# Patient Record
Sex: Male | Born: 1980 | Race: Black or African American | Hispanic: No | Marital: Single | Smoking: Current every day smoker
Health system: Southern US, Community
[De-identification: ages and names within clinical notes are randomized; demographics above are authoritative.]

---

## 2004-07-27 ENCOUNTER — Emergency Department: Payer: Self-pay | Admitting: Emergency Medicine

## 2007-02-11 ENCOUNTER — Emergency Department: Payer: Self-pay | Admitting: Emergency Medicine

## 2008-10-15 ENCOUNTER — Emergency Department: Payer: Self-pay | Admitting: Emergency Medicine

## 2008-10-17 ENCOUNTER — Emergency Department: Payer: Self-pay | Admitting: Emergency Medicine

## 2008-12-10 ENCOUNTER — Emergency Department: Payer: Self-pay | Admitting: Emergency Medicine

## 2009-01-20 IMAGING — CR DG ABDOMEN 3V
1 series · 5 of 5 positions shown · non-contrast
Comparison: none

REASON FOR EXAM: abdominal pain
COMMENTS:

[Series 1: view not recorded · 0.17mm/px · 5 of 5 slices shown]
[im 1/5]
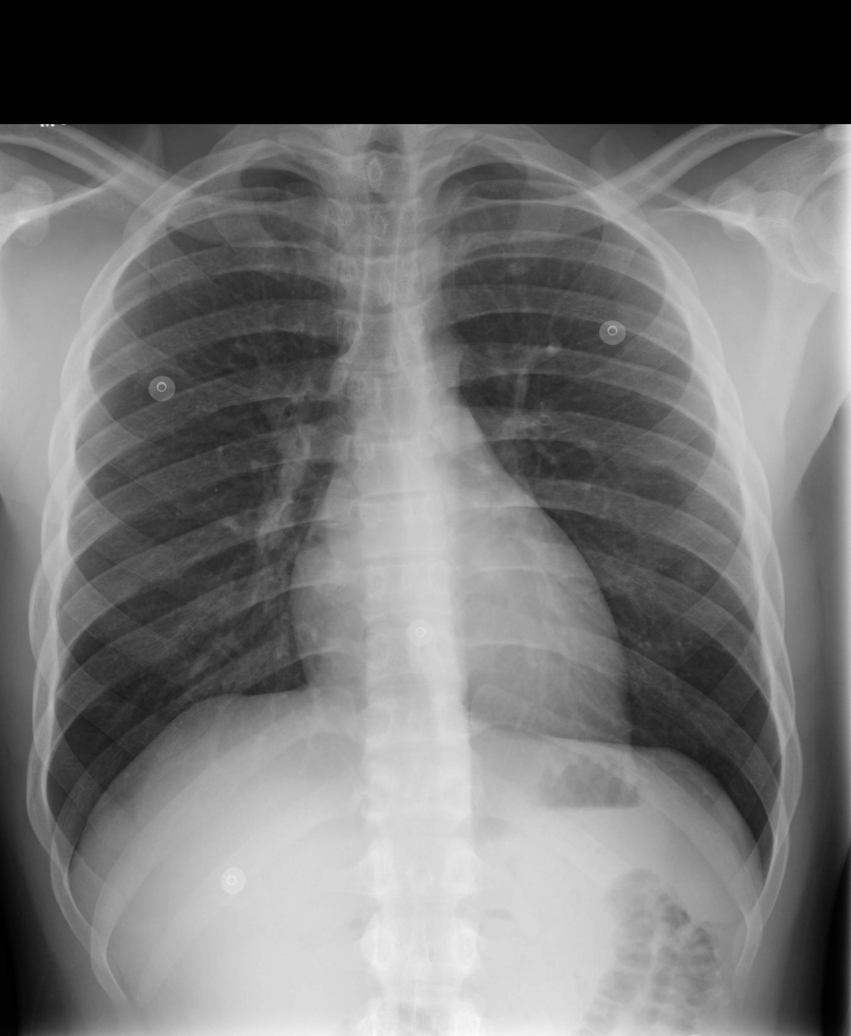
[im 2/5]
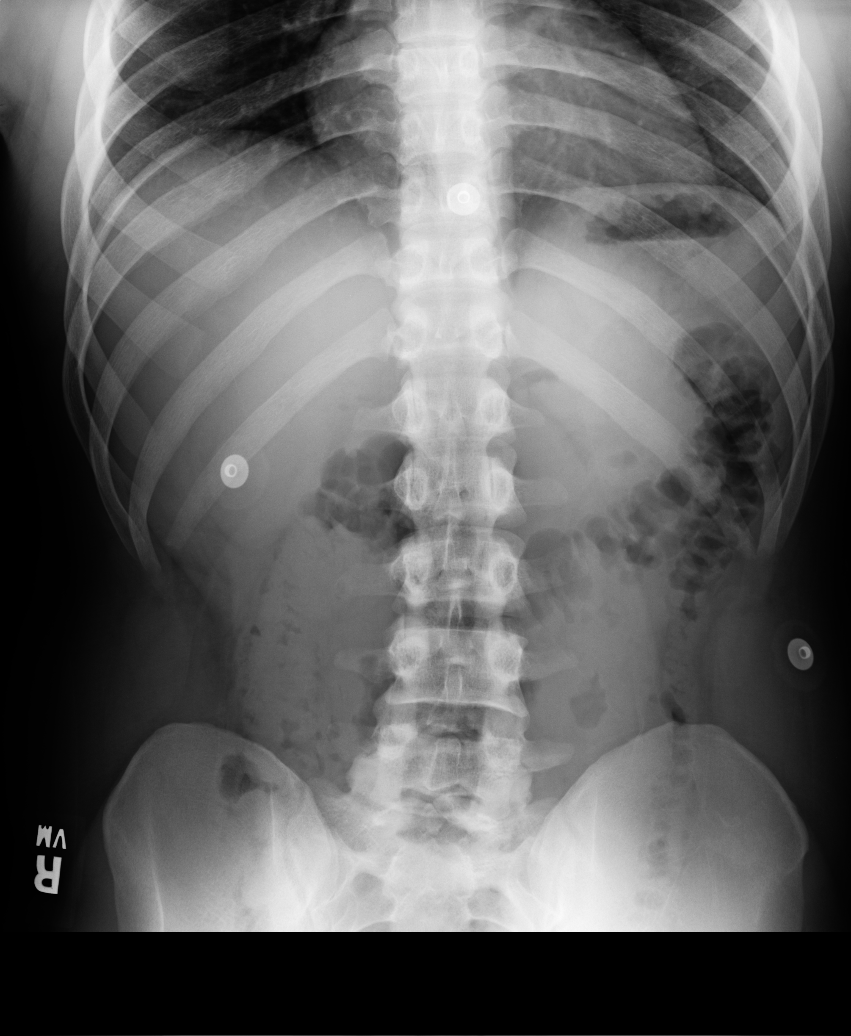
[im 3/5]
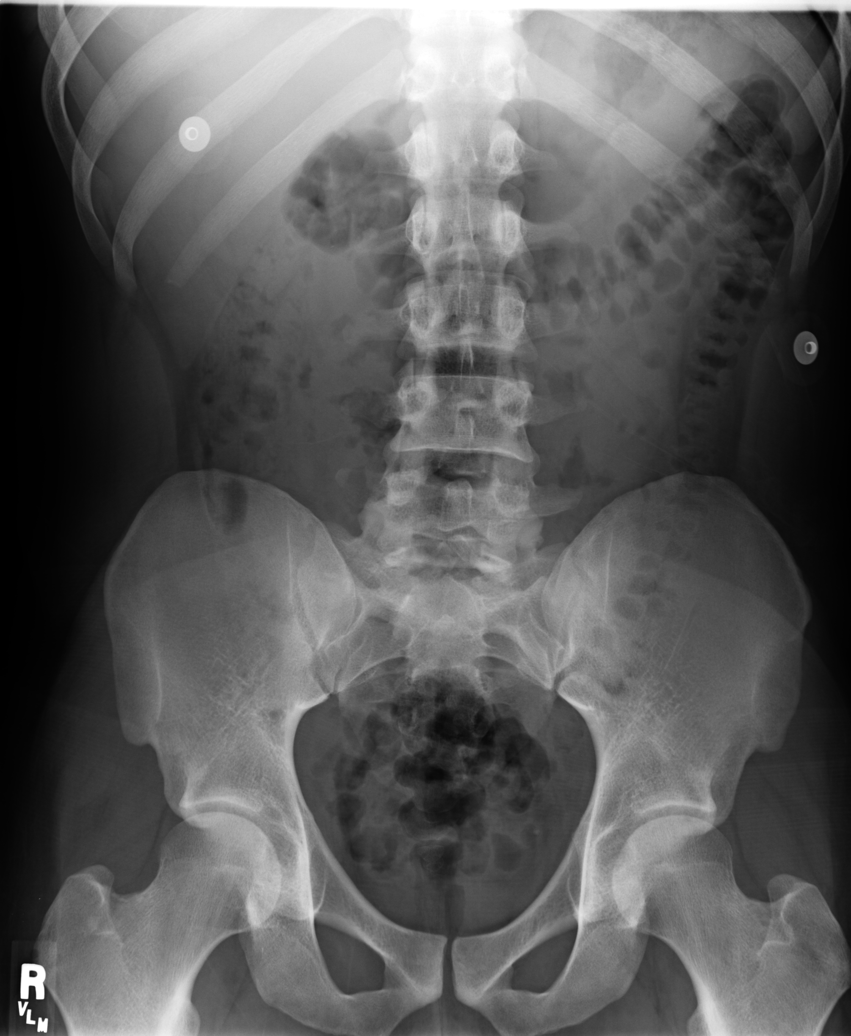
[im 4/5]
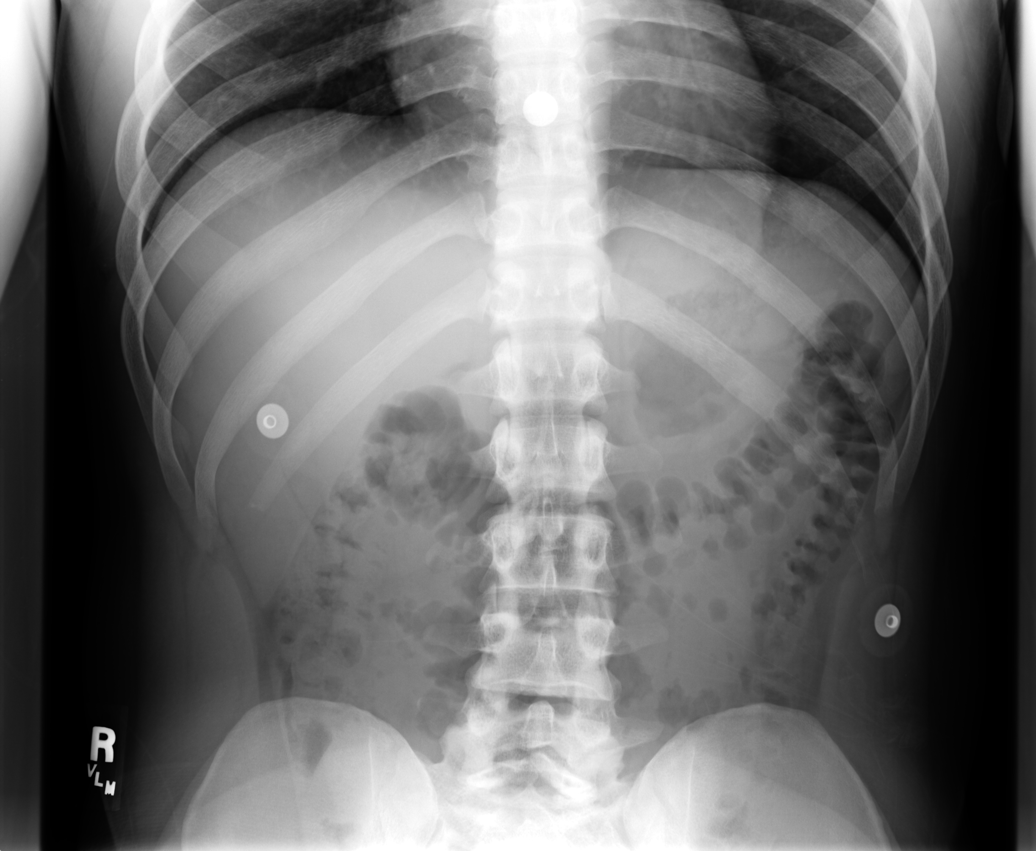
[im 5/5]
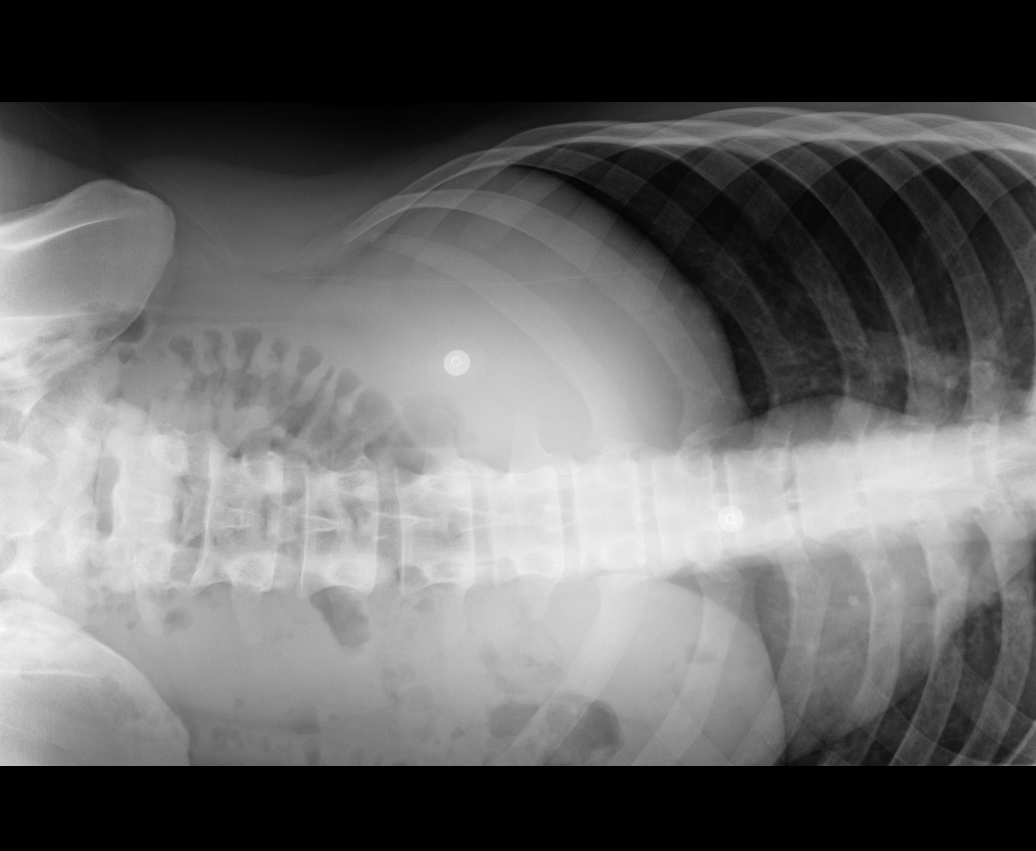

[5 of 5 positions shown; findings below may reference images not displayed]

PROCEDURE:     DXR - DXR ABDOMEN 3-WAY (INCL PA CXR)  - February 11, 2007  [DATE]

RESULT:     Single frontal view of the chest demonstrates no evidence of
focal infiltrates, effusions, or edema.

Air is seen within nondilated loops of large and small bowel.  A moderate
amount of stool is appreciated within the colon. The visualized bony
skeleton demonstrates no evidence of fracture or dislocation.
IMPRESSION: Nonobstructive bowel gas pattern.

Chest radiograph without evidence of acute cardiopulmonary disease.

## 2009-02-02 ENCOUNTER — Emergency Department: Payer: Self-pay | Admitting: Unknown Physician Specialty

## 2009-02-04 ENCOUNTER — Emergency Department: Payer: Self-pay | Admitting: Emergency Medicine

## 2010-08-17 ENCOUNTER — Emergency Department: Payer: Self-pay | Admitting: Emergency Medicine

## 2010-08-29 ENCOUNTER — Emergency Department: Payer: Self-pay | Admitting: Emergency Medicine

## 2012-02-06 ENCOUNTER — Emergency Department: Payer: Self-pay | Admitting: Emergency Medicine

## 2012-09-30 ENCOUNTER — Emergency Department: Payer: Self-pay | Admitting: Emergency Medicine

## 2013-03-19 ENCOUNTER — Ambulatory Visit: Payer: Self-pay | Admitting: General Practice

## 2013-03-19 LAB — CBC
HCT: 41.4 % (ref 40.0–52.0)
MCV: 86 fL (ref 80–100)
RDW: 14.9 % — ABNORMAL HIGH (ref 11.5–14.5)

## 2013-03-19 LAB — BASIC METABOLIC PANEL
Anion Gap: 4 — ABNORMAL LOW (ref 7–16)
Calcium, Total: 9.2 mg/dL (ref 8.5–10.1)
Chloride: 106 mmol/L (ref 98–107)
Co2: 28 mmol/L (ref 21–32)
Creatinine: 1.02 mg/dL (ref 0.60–1.30)
Potassium: 3.7 mmol/L (ref 3.5–5.1)
Sodium: 138 mmol/L (ref 136–145)

## 2014-12-13 NOTE — Op Note (Signed)
PATIENT NAME:  Dennis Burgess, Dennis L MR#:  [id number] DATE OF BIRTH:  30-May-1981  DATE OF PROCEDURE:  03/19/2013  PREOPERATIVE DIAGNOSIS: Laceration to the first webspace of the left hand.   POSTOPERATIVE DIAGNOSIS: Laceration to the first webspace of the left hand.  PROCEDURE PERFORMED: Irrigation, debridement and repair of laceration to the first webspace of the left hand.   SURGEON: Illene LabradorJames P. Angie FavaHooten Jr., MD  ANESTHESIA: General.   ESTIMATED BLOOD LOSS: None.   FLUIDS REPLACED: 500 mL of crystalloid.   TOURNIQUET TIME: Not used.   DRAINS: None.   INDICATIONS FOR SURGERY: The patient is a 34 year old right-hand dominant male who sustained a laceration to the first webspace of the left hand while at work. The Emergency Room physician had difficulty with hemostasis and asked for consultation and assistance. After discussion of the risks and benefits of surgical intervention, the patient expressed understanding of the risks and benefits and agreed with plans for surgical intervention.   PROCEDURE IN DETAIL: The patient was brought into the operating room and, after adequate general anesthesia was achieved, a tourniquet was placed on the patient's upper left arm. The patient's left hand was cleaned and prepped with Betadine. Direct pressure had been applied to the laceration site during the period of time in the operating room. A "timeout" was performed as per usual protocol. The laceration measured 3 cm in its entirety. The laceration site was carefully retracted open. No gross bleeding was encountered at the site. There appeared to be good capillary refill. The laceration did extend through muscle tissue, although no direct bleeding from the muscle tissue was noted. The wound was irrigated with copious amounts of normal saline with antibiotic solution. Again, there was no evidence of pulsatile bleeding or significant bleeding from the wound itself. The skin edges were reapproximated with  interrupted sutures of #4-0 nylon. A sterile dressing was applied, followed by application of a bulky hand dressing. The patient tolerated the procedure well. He was transported to the recovery room in stable condition.   ____________________________ Illene LabradorJames P. Angie FavaHooten Jr., MD jph:OSi D: 03/20/2013 01:32:23 ET T: 03/20/2013 05:45:45 ET JOB#: [id number]  cc: Illene LabradorJames P. Angie FavaHooten Jr., MD, <Dictator> Illene LabradorJAMES P Angie FavaHOOTEN JR MD ELECTRONICALLY SIGNED 03/23/2013 18:06

## 2015-02-15 ENCOUNTER — Encounter: Payer: Self-pay | Admitting: Emergency Medicine

## 2015-02-15 ENCOUNTER — Emergency Department
Admission: EM | Admit: 2015-02-15 | Discharge: 2015-02-15 | Disposition: A | Discharge status: Home / Self Care | Payer: Managed Care, Other (non HMO) | Attending: Emergency Medicine | Admitting: Emergency Medicine

## 2015-02-15 DIAGNOSIS — Z720 Tobacco use: Secondary | ICD-10-CM | POA: Diagnosis not present

## 2015-02-15 DIAGNOSIS — L02215 Cutaneous abscess of perineum: Secondary | ICD-10-CM | POA: Diagnosis present

## 2015-02-15 MED ORDER — SULFAMETHOXAZOLE-TRIMETHOPRIM 800-160 MG PO TABS: 1.0000 | ORAL_TABLET | Freq: Two times a day (BID) | ORAL | Status: DC

## 2015-02-15 MED ORDER — LIDOCAINE HCL (PF) 1 % IJ SOLN
2.0000 mL | Freq: Once | INTRAMUSCULAR | Status: AC
  Administered 2015-02-15: 2 mL

## 2015-02-15 MED ORDER — LIDOCAINE HCL (PF) 1 % IJ SOLN
INTRAMUSCULAR | Status: AC
  Administered 2015-02-15: 2 mL
  Filled 2015-02-15: qty 10

## 2015-02-15 MED ORDER — HYDROCODONE-ACETAMINOPHEN 5-325 MG PO TABS
1.0000 | ORAL_TABLET | Freq: Four times a day (QID) | ORAL | Status: DC | PRN
Start: 2015-02-15 — End: 2018-06-20

## 2015-02-15 NOTE — Discharge Instructions (Signed)

## 2015-02-15 NOTE — ED Notes (Signed)
Patient states that he has an abscess to hit buttocks times one week.

## 2015-02-15 NOTE — ED Provider Notes (Signed)
CSN: [id number]     Arrival date & time 02/15/15  2127 History   First MD Initiated Contact with Patient 02/15/15 2159     Chief Complaint  Patient presents with  . Abscess     (Consider location/radiation/quality/duration/timing/severity/associated sxs/prior Treatment) HPI  34 year old male presents to the emergency department for evaluation of perineal abscess. Patient has had abscess formation for 7 days. He believes the small abscess began after he was shaving. Patient denies any fevers warmth redness. Pain is 7 out of 10 with walking only. He describes the pain as pressure. He has no pain with rest. He has a history of a similar abscess in the same region years ago. No fevers numbness tingling or weakness in the lower extremities.   History reviewed. No pertinent past medical history. History reviewed. No pertinent past surgical history. History reviewed. No pertinent family history. History  Substance Use Topics  . Smoking status: Current Every Day Smoker -- 0.50 packs/day for 1 years    Types: Cigarettes  . Smokeless tobacco: Not on file  . Alcohol Use: Yes     Comment: occasionaly    Review of Systems  Constitutional: Negative.  Negative for fever, chills, activity change and appetite change.  HENT: Negative for congestion, ear pain, mouth sores, rhinorrhea, sinus pressure, sore throat and trouble swallowing.   Eyes: Negative for photophobia, pain and discharge.  Respiratory: Negative for cough, chest tightness and shortness of breath.   Cardiovascular: Negative for chest pain and leg swelling.  Gastrointestinal: Negative for nausea, vomiting, abdominal pain, diarrhea and abdominal distention.  Genitourinary: Negative for dysuria and difficulty urinating.  Musculoskeletal: Negative for back pain, arthralgias and gait problem.  Skin: Positive for wound. Negative for color change.  Neurological: Negative for dizziness and headaches.  Hematological: Negative for adenopathy.   Psychiatric/Behavioral: Negative for behavioral problems and agitation.      Allergies  Review of patient's allergies indicates not on file.  Home Medications   Prior to Admission medications   Medication Sig Start Date End Date Taking? Authorizing Provider  HYDROcodone-acetaminophen (NORCO) 5-325 MG per tablet Take 1 tablet by mouth every 6 (six) hours as needed for moderate pain. 02/15/15   Evon Slack, PA-C  sulfamethoxazole-trimethoprim (BACTRIM DS,SEPTRA DS) 800-160 MG per tablet Take 1 tablet by mouth 2 (two) times daily. 02/15/15   Evon Slack, PA-C   BP 128/85 mmHg  Pulse 83  Temp(Src) 98.2 F (36.8 C) (Oral)  Resp 18  Ht  (1.676 m)  Wt 189 lb (85.73 kg)  BMI 30.52 kg/m2  SpO2 98% Physical Exam  Constitutional: He is oriented to person, place, and time. He appears well-developed and well-nourished.  HENT:  Head: Normocephalic and atraumatic.  Eyes: Conjunctivae and EOM are normal. Pupils are equal, round, and reactive to light.  Neck: Normal range of motion. Neck supple.  Cardiovascular: Normal rate, regular rhythm, normal heart sounds and intact distal pulses.   Pulmonary/Chest: Effort normal and breath sounds normal. No respiratory distress. He has no wheezes. He has no rales. He exhibits no tenderness.  Abdominal: Soft. Bowel sounds are normal. He exhibits no distension. There is no tenderness.  Genitourinary: Rectum normal and penis normal. No penile tenderness.  No perirectal abscess, external hemorrhoid or tenderness.  Musculoskeletal: Normal range of motion. He exhibits no edema or tenderness.  Neurological: He is alert and oriented to person, place, and time.  Skin: Skin is warm and dry.  Between the base of the scrotum and the  rectum there is a small 2 x 2 centimeter firm fluctuant abscess. There is mild erythema surrounding the abscess. Fluctuant area is minimally tender. No necrotic tissue present throughout perineal region.  Psychiatric: He has  a normal mood and affect. His behavior is normal. Judgment and thought content normal.    ED Course  Procedures (including critical care time) INCISION AND DRAINAGE Performed by: Patience Musca Consent: Verbal consent obtained. Risks and benefits: risks, benefits and alternatives were discussed Type: abscess  Body area: Perineal abscess small 2 x 2 centimeters  Anesthesia: local infiltration  Incision was made with a scalpel.  Local anesthetic: lidocaine 1 % without epinephrine  Anesthetic total: 2 ml  Complexity: complex Blunt dissection to break up loculations  Drainage: purulent  Drainage amount: 3 cc purulent drainage  Packing material: No packing. Too small  Patient tolerance: Patient tolerated the procedure well with no immediate complications.   Labs Review Labs Reviewed - No data to display  Imaging Review No results found.   EKG Interpretation None      MDM   Final diagnoses:  Perineal abscess, superficial    34 year old male with small superficial perineal abscess. Patient received nearly 100% relief after incision and drainage. Patient will be started on Bactrim. He will take Norco as needed for pain. Follow-up in 2-3 days for recheck. No signs of forniers gangrene.    Evon Slack, PA-C 02/15/15 2231  Darien Ramus, MD 02/16/15 [phone]

## 2015-03-16 ENCOUNTER — Emergency Department
Admission: EM | Admit: 2015-03-16 | Discharge: 2015-03-16 | Disposition: A | Discharge status: Home / Self Care | Payer: Managed Care, Other (non HMO) | Attending: Emergency Medicine | Admitting: Emergency Medicine

## 2015-03-16 ENCOUNTER — Encounter: Payer: Self-pay | Admitting: Emergency Medicine

## 2015-03-16 DIAGNOSIS — K0381 Cracked tooth: Secondary | ICD-10-CM | POA: Diagnosis not present

## 2015-03-16 DIAGNOSIS — K088 Other specified disorders of teeth and supporting structures: Secondary | ICD-10-CM | POA: Insufficient documentation

## 2015-03-16 DIAGNOSIS — K0889 Other specified disorders of teeth and supporting structures: Secondary | ICD-10-CM

## 2015-03-16 DIAGNOSIS — Z792 Long term (current) use of antibiotics: Secondary | ICD-10-CM | POA: Diagnosis not present

## 2015-03-16 DIAGNOSIS — Z720 Tobacco use: Secondary | ICD-10-CM | POA: Insufficient documentation

## 2015-03-16 MED ORDER — IBUPROFEN 800 MG PO TABS: 800.0000 mg | ORAL_TABLET | Freq: Three times a day (TID) | ORAL | Status: DC | PRN

## 2015-03-16 MED ORDER — OXYCODONE-ACETAMINOPHEN 5-325 MG PO TABS
1.0000 | ORAL_TABLET | Freq: Once | ORAL | Status: AC
  Administered 2015-03-16: 1 via ORAL
  Filled 2015-03-16: qty 1

## 2015-03-16 MED ORDER — AMOXICILLIN 500 MG PO CAPS: 500.0000 mg | ORAL_CAPSULE | Freq: Three times a day (TID) | ORAL | Status: DC

## 2015-03-16 MED ORDER — TRAMADOL HCL 50 MG PO TABS: 50.0000 mg | ORAL_TABLET | Freq: Four times a day (QID) | ORAL | Status: DC | PRN

## 2015-03-16 MED ORDER — IBUPROFEN 800 MG PO TABS
800.0000 mg | ORAL_TABLET | Freq: Once | ORAL | Status: AC
  Administered 2015-03-16: 800 mg via ORAL
  Filled 2015-03-16: qty 1

## 2015-03-16 NOTE — ED Provider Notes (Signed)
[facility] Emergency Department Provider Note  ____________________________________________  Time seen: Approximately 7:50 PM  I have reviewed the triage vital signs and the nursing notes.   HISTORY  Chief Complaint Dental Pain    HPI Dennis Burgess is a 34 y.o. male patient complained of dental pain for 1 week. Patient state he's had a broken tooth for over 3 months and the pain is getting increasingly worse. Patient state is unable to chew on that side secondary to complain of pain. Patient rated his pain as a 10 over 10. Patient state pain is is not relieved with over-the-counter medications. Patient denies any fever or chills associated with this complaint.   History reviewed. No pertinent past medical history.  There are no active problems to display for this patient.   History reviewed. No pertinent past surgical history.  Current Outpatient Rx  Name  Route  Sig  Dispense  Refill  . amoxicillin (AMOXIL) 500 MG capsule   Oral   Take 1 capsule (500 mg total) by mouth 3 (three) times daily.   30 capsule   0   . HYDROcodone-acetaminophen (NORCO) 5-325 MG per tablet   Oral   Take 1 tablet by mouth every 6 (six) hours as needed for moderate pain.   15 tablet   0   . ibuprofen (ADVIL,MOTRIN) 800 MG tablet   Oral   Take 1 tablet (800 mg total) by mouth every 8 (eight) hours as needed for moderate pain.   15 tablet   0   . sulfamethoxazole-trimethoprim (BACTRIM DS,SEPTRA DS) 800-160 MG per tablet   Oral   Take 1 tablet by mouth 2 (two) times daily.   14 tablet   0   . traMADol (ULTRAM) 50 MG tablet   Oral   Take 1 tablet (50 mg total) by mouth every 6 (six) hours as needed for moderate pain.   12 tablet   0     Allergies Review of patient's allergies indicates no known allergies.  History reviewed. No pertinent family history.  Social History History  Substance Use Topics  . Smoking status: Current Every Day Smoker -- 0.50  packs/day for 1 years    Types: Cigarettes  . Smokeless tobacco: Not on file  . Alcohol Use: Yes     Comment: occasionaly    Review of Systems Constitutional: No fever/chills Eyes: No visual changes. ENT: No sore throat. Until pain Cardiovascular: Denies chest pain. Respiratory: Denies shortness of breath. Gastrointestinal: No abdominal pain.  No nausea, no vomiting.  No diarrhea.  No constipation. Genitourinary: Negative for dysuria. Musculoskeletal: Negative for back pain. Skin: Negative for rash. Neurological: Negative for headaches, focal weakness or numbness. 10-point ROS otherwise negative.  ____________________________________________   PHYSICAL EXAM:  VITAL SIGNS: ED Triage Vitals  Enc Vitals Group     BP 03/16/15 1851 131/62 mmHg     Pulse Rate 03/16/15 1851 91     Resp 03/16/15 1851 18     Temp 03/16/15 1851 98 F (36.7 C)     Temp Source 03/16/15 1851 Oral     SpO2 03/16/15 1851 97 %     Weight 03/16/15 1851 180 lb (81.647 kg)     Height 03/16/15 1851  (1.676 m)     Head Cir --      Peak Flow --      Pain Score 03/16/15 1853 10     Pain Loc --      Pain Edu? --  Excl. in GC? --     Constitutional: Alert and oriented. Well appearing and in no acute distress. Eyes: Conjunctivae are normal. PERRL. EOMI. Head: Atraumatic. Nose: No congestion/rhinnorhea. Mouth/Throat: Mucous membranes are moist.  Oropharynx non-erythematous. Fractured tooth #29. Gingiva is not erythematous or edematous. Neck: No stridor.  No cervical spine tenderness to palpation. Hematological/Lymphatic/Immunilogical: No cervical lymphadenopathy. Cardiovascular: Normal rate, regular rhythm. Grossly normal heart sounds.  Good peripheral circulation. Respiratory: Normal respiratory effort.  No retractions. Lungs CTAB. Gastrointestinal: Soft and nontender. No distention. No abdominal bruits. No CVA tenderness. Musculoskeletal: No lower extremity tenderness nor edema.  No joint  effusions. Neurologic:  Normal speech and language. No gross focal neurologic deficits are appreciated. No gait instability. Skin:  Skin is warm, dry and intact. No rash noted. Psychiatric: Mood and affect are normal. Speech and behavior are normal.  ____________________________________________   LABS (all labs ordered are listed, but only abnormal results are displayed)  Labs Reviewed - No data to display ____________________________________________  EKG   ____________________________________________  RADIOLOGY   ____________________________________________   PROCEDURES  Procedure(s) performed: None  Critical Care performed: No  ____________________________________________   INITIAL IMPRESSION / ASSESSMENT AND PLAN / ED COURSE  Pertinent labs & imaging results that were available during my care of the patient were reviewed by me and considered in my medical decision making (see chart for details).  Dental pain secondary to fractured tooth. Patient advised follow-up for shortness of dental clinic provided on his discharge. Patient given 3 days supply of tramadol and finally supply of ibuprofen. Patient is also prescribed amoxicillin. Patient advised return to ER if his condition worsens before seeing the dentist.  OPTIONS FOR DENTAL FOLLOW UP CARE  [place] [facility] - Local Safety Net Dental Clinics [email or web address]   [facility] [phone])  Sharl Ma [phone])  [place] [phone])  [facility] [phone])  [facility] [phone]) This clinic caters to the indigent population and is on a lottery system. Location: [employer] of Dentistry, [employer], [address], [place] Clinic Hours: Wednesdays from 6pm - 9pm, patients seen by a lottery system. For dates, call or go to  [email or web address] Services: Cleanings, fillings and simple extractions. Payment Options: DENTAL WORK IS FREE OF CHARGE. Bring proof of income or support. Best way to get seen: Arrive at 5:15 pm - this is a lottery, NOT first come/first serve, so arriving earlier will not increase your chances of being seen.     [facility] [phone] Select option 1 for emergencies   Location: [facility] of Dentistry, [place], [address] [place] Clinic Hours: No walk-ins accepted - call the day before to schedule an appointment. Check in times are 9:30 am and 1:30 pm. Services: Simple extractions, temporary fillings, pulpectomy/pulp debridement, uncomplicated abscess drainage. Payment Options: PAYMENT IS DUE AT THE TIME OF SERVICE.  Fee is usually $100-200, additional surgical procedures (e.g. abscess drainage) may be extra. Cash, checks, Visa/MasterCard accepted.  Can file Medicaid if patient is covered for dental - patient should call case worker to check. No discount for [facility] patients. Best way to get seen: MUST call the day before and get onto the schedule. Can usually be seen the next 1-2 days. No walk-ins accepted.     [facility] [phone]   Location: [facility], [address], [place] Clinic Hours: M, W, Th, F 8am or 1:30pm,  Tues 9a or 1:30 - first come/first served. Services: Simple extractions, temporary fillings, uncomplicated abscess drainage.  You do not need to be an [facility] resident. Payment Options: PAYMENT IS DUE AT THE TIME OF SERVICE. Dental insurance, otherwise sliding scale - bring proof of income or support. Depending on income and treatment needed, cost is usually $50-200. Best way to get seen: Arrive early as it is first come/first served.     [employer] [phone]   Location: [address] Clinic Hours: Mon-Thu 8a-5p Services: Most basic dental services including extractions and fillings. Payment Options: PAYMENT IS DUE AT THE TIME OF SERVICE. Sliding scale, up to 50% off - bring proof if income or support. Medicaid with dental option accepted. Best way to get seen: Call to schedule an appointment, can usually be seen within 2 weeks OR they will try to see walk-ins - show up at 8a or 2p (you may have to wait).     [facility] [phone] [place] RESIDENTS ONLY   Location: [facility], [address], [place], [place] [address] Clinic Hours: By appointment only. Monday - Thursday 8am-5pm, Friday 8am-12pm Services: Cleanings, fillings, extractions. Payment Options: PAYMENT IS DUE AT THE TIME OF SERVICE. Cash, Visa or MasterCard. Sliding scale - $30 minimum per service. Best way to get seen: Come in to office, complete packet and make an appointment - need proof of income or support monies for each household member and proof of [facility] residence. Usually takes about a month to get in.     [facility] [phone]   Location: [address] [facility] Clinic Hours: Walk-in Urgent Care Dental Services are offered Monday-Friday mornings only. The numbers of emergencies accepted daily is limited to the number of providers available. Maximum 15 - Mondays, Wednesdays & Thursdays Maximum 10 - Tuesdays & Fridays Services: You do not need to be a [facility] resident to be seen for a dental emergency. Emergencies are defined as pain, swelling, abnormal bleeding, or dental trauma. Walkins will receive x-rays if needed. NOTE: Dental cleaning is not an emergency. Payment Options: PAYMENT IS DUE AT THE TIME OF SERVICE. Minimum co-pay is $40.00 for uninsured patients. Minimum co-pay is $3.00 for Medicaid with dental coverage. Dental Insurance is accepted and must be presented at time of  visit. Medicare does not cover dental. Forms of payment: Cash, credit card, checks. Best way to get seen: If not previously registered with the clinic, walk-in dental registration begins at 7:15 am and is on a first come/first serve basis. If previously registered with the clinic, call to make an appointment.     [facility] [phone] [place] RESIDENTS ONLY   Location: [address], [place], [place] Clinic Hours: Mon-Thu 10a-2p Services: Extractions only! Payment Options: FREE (donations accepted) - bring proof of income or support Best way to get seen: Call and schedule an appointment OR come at 8am on the 1st Monday of every month (except for holidays) when it is first come/first served.     [facility] [phone]   Location: [address] [place], [place] Clinic Hours: Friday mornings Services, Payment Options, Best way to get seen: Call for info  ____________________________________________   FINAL CLINICAL IMPRESSION(S) / ED DIAGNOSES  Final diagnoses:  Pain, dental      Joni Reining, PA-C 03/16/15 2000  Emily Filbert, MD 03/16/15 2102

## 2015-03-16 NOTE — Discharge Instructions (Signed)
Dental Pain °Toothache is pain in or around a tooth. It may get worse with chewing or with cold or heat.  °HOME CARE °· Your dentist may use a numbing medicine during treatment. If so, you may need to avoid eating until the medicine wears off. Ask your dentist about this. °· Only take medicine as told by your dentist or doctor. °· Avoid chewing food near the painful tooth until after all treatment is done. Ask your dentist about this. °GET HELP RIGHT AWAY IF:  °· The problem gets worse or new problems appear. °· You have a fever. °· There is redness and puffiness (swelling) of the face, jaw, or neck. °· You cannot open your mouth. °· There is pain in the jaw. °· There is very bad pain that is not helped by medicine. °MAKE SURE YOU:  °· Understand these instructions. °· Will watch your condition. °· Will get help right away if you are not doing well or get worse. °Document Released: 01/26/2008 Document Revised: 11/01/2011 Document Reviewed: 01/26/2008 °ExitCare® Patient Information ©2015 [employer]. This information is not intended to replace advice given to you by your health care provider. Make sure you discuss any questions you have with your health care provider. °OPTIONS FOR DENTAL FOLLOW UP CARE ° °[place] Department of Health and Human Services - Local Safety Net Dental Clinics °http://www.ncdhhs.gov/dph/oralhealth/services/safetynetclinics.htm °  °[facility] (336-562-3123) ° °[facility] (919-933-9087) ° °[facility] ([phone] [phone]) ° °[facility] (336-570-6415) ° °[facility] ([phone]) °This clinic caters to the indigent population and is on a lottery system. °Location: °[facility], [address], [place] °Clinic Hours: °Wednesdays from 6pm - 9pm, patients seen by a lottery system. °For dates, call or go to [email or web address] °Services: °Cleanings, fillings and simple  extractions. °Payment Options: °DENTAL WORK IS FREE OF CHARGE. Bring proof of income or support. °Best way to get seen: °Arrive at 5:15 pm - this is a lottery, NOT first come/first serve, so arriving earlier will not increase your chances of being seen. °  °  °[facility] °[phone] °Select option 1 for emergencies °  °Location: °[facility], [address], [place] °Clinic Hours: °No walk-ins accepted - call the day before to schedule an appointment. °Check in times are 9:30 am and 1:30 pm. °Services: °Simple extractions, temporary fillings, pulpectomy/pulp debridement, uncomplicated abscess drainage. °Payment Options: °PAYMENT IS DUE AT THE TIME OF SERVICE.  Fee is usually $100-200, additional surgical procedures (e.g. abscess drainage) may be extra. °Cash, checks, Visa/MasterCard accepted.  Can file Medicaid if patient is covered for dental - patient should call case worker to check. °No discount for [facility] patients. °Best way to get seen: °MUST call the day before and get onto the schedule. Can usually be seen the next 1-2 days. No walk-ins accepted. °  °  °[facility] °[phone] °  °Location: °[facility], [address], [place] °Clinic Hours: °M, W, Th, F 8am or 1:30pm, Tues 9a or 1:30 - first come/first served. °Services: °Simple extractions, temporary fillings, uncomplicated abscess drainage.  You do not need to be an [place] resident. °Payment Options: °PAYMENT IS DUE AT THE TIME OF SERVICE. °Dental insurance, otherwise sliding scale - bring proof of income or support. °Depending on income and treatment needed, cost is usually $50-200. °Best way to get seen: °Arrive early as it is first come/first served. °  °  °  [facility] °[phone] °  °Location: °[address] °Clinic Hours: °Mon-Thu 8a-5p °Services: °Most basic dental services including  extractions and fillings. °Payment Options: °PAYMENT IS DUE AT THE TIME OF SERVICE. °Sliding scale, up to 50% off - bring proof if income or support. °Medicaid with dental option accepted. °Best way to get seen: °Call to schedule an appointment, can usually be seen within 2 weeks OR they will try to see walk-ins - show up at 8a or 2p (you may have to wait). °  °  °[facility] °919-245-2435 °[place] RESIDENTS ONLY °  °Location: °[facility], [address], [place], [place] [address] °Clinic Hours: By appointment only. °Monday - Thursday 8am-5pm, Friday 8am-12pm °Services: Cleanings, fillings, extractions. °Payment Options: °PAYMENT IS DUE AT THE TIME OF SERVICE. °Cash, Visa or MasterCard. Sliding scale - $30 minimum per service. °Best way to get seen: °Come in to office, complete packet and make an appointment - need proof of income °or support monies for each household member and proof of [place] residence. °Usually takes about a month to get in. °  °  °[facility] °[phone] °  °Location: °[address] [place] °Clinic Hours: Walk-in Urgent Care Dental Services are offered Monday-Friday mornings only. °The numbers of emergencies accepted daily is limited to the number of °providers available. °Maximum 15 - Mondays, Wednesdays & Thursdays °Maximum 10 - Tuesdays & Fridays °Services: °You do not need to be a [place] resident to be seen for a dental emergency. °Emergencies are defined as pain, swelling, abnormal bleeding, or dental trauma. Walkins will receive x-rays if needed. °NOTE: Dental cleaning is not an emergency. °Payment Options: °PAYMENT IS DUE AT THE TIME OF SERVICE. °Minimum co-pay is $40.00 for uninsured patients. °Minimum co-pay is $3.00 for Medicaid with dental coverage. °Dental Insurance is accepted and must be presented at time of visit. °Medicare does not cover dental. °Forms of payment: Cash, credit card,  checks. °Best way to get seen: °If not previously registered with the clinic, walk-in dental registration begins at 7:15 am and is on a first come/first serve basis. °If previously registered with the clinic, call to make an appointment. °  °  °[facility] °[phone] °[place] RESIDENTS ONLY °  °Location: °[address], [place],  °Clinic Hours: °Mon-Thu 10a-2p °Services: Extractions only! °Payment Options: °FREE (donations accepted) - bring proof of income or support °Best way to get seen: °Call and schedule an appointment OR come at 8am on the 1st Monday of every month (except for holidays) when it is first come/first served. °  °  °[facility] °[phone] °  °Location: °[address], [place] °Clinic Hours: °Friday mornings °Services, Payment Options, Best way to get seen: °Call for info ° °

## 2015-03-16 NOTE — ED Notes (Signed)
AAOx3.  Skin warm and dry.  NAD.  D/C home 

## 2015-03-16 NOTE — ED Notes (Signed)
Pt reports dental pain for past week. Has broken tooth on back lower right of mouth.

## 2017-08-30 ENCOUNTER — Other Ambulatory Visit: Payer: Self-pay

## 2017-08-30 ENCOUNTER — Emergency Department
Admission: EM | Admit: 2017-08-30 | Discharge: 2017-08-30 | Disposition: A | Discharge status: Home / Self Care | Payer: Managed Care, Other (non HMO) | Attending: Emergency Medicine | Admitting: Emergency Medicine

## 2017-08-30 DIAGNOSIS — F17210 Nicotine dependence, cigarettes, uncomplicated: Secondary | ICD-10-CM | POA: Insufficient documentation

## 2017-08-30 DIAGNOSIS — K0381 Cracked tooth: Secondary | ICD-10-CM | POA: Insufficient documentation

## 2017-08-30 DIAGNOSIS — K0889 Other specified disorders of teeth and supporting structures: Secondary | ICD-10-CM | POA: Insufficient documentation

## 2017-08-30 MED ORDER — TRAMADOL HCL 50 MG PO TABS: 50.0000 mg | ORAL_TABLET | Freq: Four times a day (QID) | ORAL | 0 refills | Status: DC | PRN

## 2017-08-30 MED ORDER — AMOXICILLIN 500 MG PO CAPS: 500.0000 mg | ORAL_CAPSULE | Freq: Three times a day (TID) | ORAL | 0 refills | Status: DC

## 2017-08-30 MED ORDER — IBUPROFEN 800 MG PO TABS: 800.0000 mg | ORAL_TABLET | Freq: Three times a day (TID) | ORAL | 0 refills | Status: AC | PRN

## 2017-08-30 NOTE — Discharge Instructions (Signed)
Follow-up from the list of dentists provided. OPTIONS FOR DENTAL FOLLOW UP CARE  [place] Department of Health and Human Services - Local Safety Net Dental Clinics TripDoors.comhttp://www.ncdhhs.gov/dph/oralhealth/services/safetynetclinics.htm   [facility] [phone])  [facility] [phone]  [facility] [phone]  [facility] [phone])  [facility] [phone]) This clinic caters to the indigent population and is on a lottery system. Location: [employer] [facility], [address], [place] Clinic Hours: Wednesdays from 6pm - 9pm, patients seen by a lottery system. For dates, call or go to [email or web address] Services: Cleanings, fillings and simple extractions. Payment Options: DENTAL WORK IS FREE OF CHARGE. Bring proof of income or support. Best way to get seen: Arrive at 5:15 pm - this is a lottery, NOT first come/first serve, so arriving earlier will not increase your chances of being seen.     [facility] [phone] Select option 1 for emergencies   [facility]: [facility], [address], [place] [facility] Hours: No walk-ins accepted - call the day before to schedule an appointment. Check in times are 9:30 am and 1:30 pm. Services: Simple extractions, temporary fillings, pulpectomy/pulp debridement, uncomplicated abscess drainage. Payment Options: PAYMENT IS DUE AT THE TIME OF SERVICE.  Fee is usually $100-200, additional surgical procedures (e.g. abscess drainage) may be extra. Cash, checks, Visa/MasterCard accepted.  Can file Medicaid if patient is covered for dental - patient should call case worker to check. No discount for [facility] patients. Best way to get seen: MUST call the day before and get onto the schedule. Can usually be seen the next 1-2 days. No walk-ins accepted.      [facility] [phone]   Location: [facility], [address], [place] Clinic Hours: M, W, Th, F 8am or 1:30pm, Tues 9a or 1:30 - first come/first served. Services: Simple extractions, temporary fillings, uncomplicated abscess drainage.  You do not need to [place] resident. Payment Options: PAYMENT IS DUE AT THE TIME OF SERVICE. Dental insurance, otherwise sliding scale - bring proof of income or support. Depending on income and treatment needed, cost is usually $50-200. Best way to get seen: Arrive early as it is first come/first served.     [facility] [phone]   Location: [address] Clinic Hours: Mon-Thu 8a-5p Services: Most basic dental services including extractions and fillings. Payment Options: PAYMENT IS DUE AT THE TIME OF SERVICE. Sliding scale, up to 50% off - bring proof if income or support. Medicaid with dental option accepted. Best way to get seen: Call to schedule an appointment, can usually be seen within 2 weeks OR they will try to see walk-ins - show up at 8a or 2p (you may have to wait).     [facility] [phone] [place]IDENTS ONLY   Location: [facility], [address], [place], [place] [address] Clinic Hours: By appointment only. Monday - Thursday 8am-5pm, Friday 8am-12pm Services: Cleanings, fillings, extractions. Payment Options: PAYMENT IS DUE AT THE TIME OF SERVICE. Cash, Visa or MasterCard. Sliding scale - $30 minimum per service. Best way to get seen: Come in to office, complete packet and make an appointment - need proof of income or support monies for each household member and proof of [facility] [place] residence. Usually takes about a month to get in.     [facility]  [phone] °  °Location: °[address] [place] °Clinic Hours: Walk-in Urgent Care  Dental Services are offered Monday-Friday mornings only. °The numbers of emergencies accepted daily is limited to the number of °providers available. °Maximum 15 - Mondays, Wednesdays & Thursdays °Maximum 10 - Tuesdays & Fridays °Services: °You do not need to be a [place] resident to be seen for a dental emergency. °Emergencies are defined as pain, swelling, abnormal bleeding, or dental trauma. Walkins will receive x-rays if needed. °NOTE: Dental cleaning is not an emergency. °Payment Options: °PAYMENT IS DUE AT THE TIME OF SERVICE. °Minimum co-pay is $40.00 for uninsured patients. °Minimum co-pay is $3.00 for Medicaid with dental coverage. °Dental Insurance is accepted and must be presented at time of visit. °Medicare does not cover dental. °Forms of payment: Cash, credit card, checks. °Best way to get seen: °If not previously registered with the clinic, walk-in dental registration begins at 7:15 am and is on a first come/first serve basis. °If previously registered with the clinic, call to make an appointment. °  °  °[facility] °[phone] °[place] RESIDENTS ONLY °  °Location: °[address], [place], [place] °Clinic Hours: °Mon-Thu 10a-2p °Services: Extractions only! °Payment Options: °FREE (donations accepted) - bring proof of income or support °Best way to get seen: °Call and schedule an appointment OR come at 8am on the 1st Monday of every month (except for holidays) when it is first come/first served. °  °  °[facility] °[phone] °  °Location: °[address],  °Clinic Hours: °Friday mornings °Services, Payment Options, Best way to get seen: °Call for info ° °

## 2017-08-30 NOTE — ED Provider Notes (Signed)
[facility] Emergency Department Provider Note   ____________________________________________   None    (approximate)  I have reviewed the triage vital signs and the nursing notes.   HISTORY  Chief Complaint Dental Pain    HPI Dennis Burgess is a 37 y.o. male patient complaining of dental pain for 1 week. Patient to fit the right upper molar is cracked.Patient rates pain as a 10 over 10. Patient described a pain as "achy". Patient stated no relief over-the-counter anti-inflammatory medications.   History reviewed. No pertinent past medical history.  There are no active problems to display for this patient.   History reviewed. No pertinent surgical history.  Prior to Admission medications   Medication Sig Start Date End Date Taking? Authorizing Provider  amoxicillin (AMOXIL) 500 MG capsule Take 1 capsule (500 mg total) by mouth 3 (three) times daily. 08/30/17   Joni ReiningSmith, Giankarlo Leamer K, PA-C  HYDROcodone-acetaminophen (NORCO) 5-325 MG per tablet Take 1 tablet by mouth every 6 (six) hours as needed for moderate pain. 02/15/15   Evon SlackGaines, Thomas C, PA-C  ibuprofen (ADVIL,MOTRIN) 800 MG tablet Take 1 tablet (800 mg total) by mouth every 8 (eight) hours as needed for moderate pain. 08/30/17   Joni ReiningSmith, Leanora Murin K, PA-C  sulfamethoxazole-trimethoprim (BACTRIM DS,SEPTRA DS) 800-160 MG per tablet Take 1 tablet by mouth 2 (two) times daily. 02/15/15   Evon SlackGaines, Thomas C, PA-C  traMADol (ULTRAM) 50 MG tablet Take 1 tablet (50 mg total) by mouth every 6 (six) hours as needed for moderate pain. 08/30/17   Joni ReiningSmith, Detron Carras K, PA-C    Allergies Patient has no known allergies.  No family history on file.  Social History Social History   Tobacco Use  . Smoking status: Current Every Day Smoker    Packs/day: 0.50    Years: 1.00    Pack years: 0.50    Types: Cigarettes  Substance Use Topics  . Alcohol use: Yes    Comment: occasionaly  . Drug use: No    Review of  Systems  Constitutional: No fever/chills Eyes: No visual changes. ENT: No sore throat. Dental pain Cardiovascular: Denies chest pain. Respiratory: Denies shortness of breath. Gastrointestinal: No abdominal pain.  No nausea, no vomiting.  No diarrhea.  No constipation. Genitourinary: Negative for dysuria. Musculoskeletal: Negative for back pain. Skin: Negative for rash. Neurological: Negative for headaches, focal weakness or numbness. ____________________________________________   PHYSICAL EXAM:  VITAL SIGNS: ED Triage Vitals  Enc Vitals Group     BP 08/30/17 1714 (!) 145/84     Pulse Rate 08/30/17 1714 100     Resp 08/30/17 1714 16     Temp 08/30/17 1714 98.1 F (36.7 C)     Temp Source 08/30/17 1714 Oral     SpO2 08/30/17 1714 99 %     Weight 08/30/17 1714 180 lb (81.6 kg)     Height 08/30/17 1714 5\' 6"  (1.676 m)     Head Circumference --      Peak Flow --      Pain Score 08/30/17 1713 10     Pain Loc --      Pain Edu? --      Excl. in GC? --     Constitutional: Alert and oriented. Well appearing and in no acute distress. Mouth/Throat: Mucous membranes are moist.  Oropharynx non-erythematous. Devitalize tooth #14 for gingival edema. Neck: No stridor.   Cardiovascular: Normal rate, regular rhythm. Grossly normal heart sounds.  Good peripheral circulation. Respiratory: Normal respiratory effort.  No retractions. Lungs CTAB. Neurologic:  Normal speech and language. No gross focal neurologic deficits are appreciated. No gait instability. Skin:  Skin is warm, dry and intact. No rash noted. Psychiatric: Mood and affect are normal. Speech and behavior are normal.  ____________________________________________   LABS (all labs ordered are listed, but only abnormal results are displayed)  Labs Reviewed - No data to display ____________________________________________  EKG   ____________________________________________  RADIOLOGY  No results  found.  ____________________________________________   PROCEDURES  Procedure(s) performed:   Procedures  Critical Care performed: No  ____________________________________________   INITIAL IMPRESSION / ASSESSMENT AND PLAN / ED COURSE  As part of my medical decision making, I reviewed the following data within the electronic MEDICAL RECORD NUMBER    Dental pain secondary to fractured tooth. Patient given discharge care instructions. Patient given a list of dental clinic for follow-up care. Patient advised take medication as directed.      ____________________________________________   FINAL CLINICAL IMPRESSION(S) / ED DIAGNOSES  Final diagnoses:  Toothache     ED Discharge Orders        Ordered    amoxicillin (AMOXIL) 500 MG capsule  3 times daily     08/30/17 1729    traMADol (ULTRAM) 50 MG tablet  Every 6 hours PRN     08/30/17 1729    ibuprofen (ADVIL,MOTRIN) 800 MG tablet  Every 8 hours PRN     08/30/17 1730       Note:  This document was prepared using Dragon voice recognition software and may include unintentional dictation errors.    Joni Reining, PA-C 08/30/17 1734    Merrily Brittle, MD 08/30/17 Norberta Keens

## 2017-08-30 NOTE — ED Triage Notes (Signed)
Toothache to top right side of mouth. Pt alert and oriented X4, active, cooperative, pt in NAD. RR even and unlabored, color WNL.

## 2018-06-20 ENCOUNTER — Ambulatory Visit: Payer: BLUE CROSS/BLUE SHIELD | Admitting: Podiatry

## 2018-06-20 ENCOUNTER — Encounter: Payer: Self-pay | Admitting: Podiatry

## 2018-06-20 DIAGNOSIS — B070 Plantar wart: Secondary | ICD-10-CM | POA: Diagnosis not present

## 2018-06-20 MED ORDER — CLOTRIMAZOLE-BETAMETHASONE 1-0.05 % EX CREA: 1.0000 "application " | TOPICAL_CREAM | Freq: Two times a day (BID) | CUTANEOUS | 1 refills | Status: AC

## 2018-06-21 NOTE — Progress Notes (Signed)
   Subjective: 37 year old male presenting today as a new patient with a chief complaint of stabbing pain and soreness of the plantar aspect of the left forefoot secondary to a plantar wart that appeared about one year ago. Walking and standing increases the pain. She has been picking at the area, applying ice and massaging it for treatment. Patient is here for further evaluation and treatment.   No past medical history on file.  Objective: Physical Exam General: The patient is alert and oriented x3 in no acute distress.  Dermatology: Hyperkeratotic skin lesion noted to the plantar aspect of the left foot approximately 1 cm in diameter. Pinpoint bleeding noted upon debridement. Pruritus with hyperkeratosis noted to bilateral feet. Skin is warm, dry and supple bilateral lower extremities. Negative for open lesions or macerations.  Vascular: Palpable pedal pulses bilaterally. No edema or erythema noted. Capillary refill within normal limits.  Neurological: Epicritic and protective threshold grossly intact bilaterally.   Musculoskeletal Exam: Pain on palpation to the note skin lesion.  Range of motion within normal limits to all pedal and ankle joints bilateral. Muscle strength 5/5 in all groups bilateral.   Assessment: #1 plantar wart left foot #2 tinea pedis bilateral    Plan of Care:  #1 Patient was evaluated. #2 Excisional debridement of the plantar wart lesion was performed using a chisel blade. Cantharone was applied and the lesion was dressed with a dry sterile dressing. #3 Prescription for Lotrisone cream provided to patient.  #4 patient is to return to clinic in 2 weeks  Felecia Shelling, DPM [facility]  Dr. Felecia Shelling, DPM    [address]                                        [place], [place] [address]                Office [phone]  Fax [phone]

## 2018-07-04 ENCOUNTER — Encounter: Payer: BLUE CROSS/BLUE SHIELD | Admitting: Podiatry

## 2018-07-11 NOTE — Progress Notes (Signed)
This encounter was created in error - please disregard.

## 2020-10-06 ENCOUNTER — Other Ambulatory Visit: Payer: Self-pay

## 2020-10-06 ENCOUNTER — Ambulatory Visit: Payer: Self-pay | Admitting: Physician Assistant

## 2020-10-06 DIAGNOSIS — Z113 Encounter for screening for infections with a predominantly sexual mode of transmission: Secondary | ICD-10-CM

## 2020-10-06 LAB — GRAM STAIN

## 2020-10-07 ENCOUNTER — Encounter: Payer: Self-pay | Admitting: Physician Assistant

## 2020-10-07 NOTE — Progress Notes (Signed)
   [facility] Department STI clinic/screening visit  Subjective:  Dennis Burgess is a 40 y.o. male being seen today for an STI screening visit. The patient reports they do have symptoms.    Patient has the following medical conditions:  There are no problems to display for this patient.    Chief Complaint  Patient presents with  . SEXUALLY TRANSMITTED DISEASE    screening    HPI  Patient reports that he thinks he may have either HPV or a couple of moles on his penis.  Denies other symptoms, chronic conditions, and surgeries.  States last HIV test was 7-8 years ago and last void prior to sample collection was about 2 hr ago.     See flowsheet for further details and programmatic requirements.    The following portions of the patient's history were reviewed and updated as appropriate: allergies, current medications, past medical history, past social history, past surgical history and problem list.  Objective:  There were no vitals filed for this visit.  Physical Exam Constitutional:      General: He is not in acute distress.    Appearance: Normal appearance.  HENT:     Head: Normocephalic and atraumatic.     Comments: No nits,lice, or hair loss. No cervical, supraclavicular or axillary adenopathy.    Mouth/Throat:     Mouth: Mucous membranes are moist.     Pharynx: Oropharynx is clear. No oropharyngeal exudate or posterior oropharyngeal erythema.  Eyes:     Conjunctiva/sclera: Conjunctivae normal.  Pulmonary:     Effort: Pulmonary effort is normal.  Abdominal:     Palpations: Abdomen is soft. There is no mass.     Tenderness: There is no abdominal tenderness. There is no guarding or rebound.  Genitourinary:    Penis: Normal.      Testes: Normal.     Comments: Pubic area without nits, lice, hair loss, edema, erythema, lesions and inguinal adenopathy. Penis circumcised without rash, lesions and discharge at meatus.  Shaft of penis on left side with 2 ~2  mm flesh colored, smooth surfaced papules. Musculoskeletal:     Cervical back: Neck supple. No tenderness.  Skin:    General: Skin is warm and dry.     Findings: No bruising, erythema, lesion or rash.  Neurological:     Mental Status: He is alert and oriented to person, place, and time.  Psychiatric:        Mood and Affect: Mood normal.        Behavior: Behavior normal.        Thought Content: Thought content normal.        Judgment: Judgment normal.       Assessment and Plan:  Dennis Burgess is a 40 y.o. male presenting to the [facility] Department for STI screening  1. Screening for STD (sexually transmitted disease) Patient into clinic with symptoms. Counseled patient that the papules may be developing HPV or moles and that he should follow up with his PCP or Dermatology to confirm. Reviewed Gram stain results with patient and no treatment needed at this time. Rec condoms with all sex. Await test results.  Counseled that RN will call if needs to RTC for treatment once results are back. - Gram stain - Gonococcus culture - HIV [place] [facility] - Syphilis Serology, [place] [facility] - Gonococcus culture     No follow-ups on file.  No future appointments.  Matt Holmes, PA

## 2020-10-11 LAB — GONOCOCCUS CULTURE

## 2023-02-04 ENCOUNTER — Ambulatory Visit: Payer: Self-pay | Admitting: Family Medicine

## 2023-02-04 ENCOUNTER — Encounter: Payer: Self-pay | Admitting: Family Medicine

## 2023-02-04 DIAGNOSIS — Z113 Encounter for screening for infections with a predominantly sexual mode of transmission: Secondary | ICD-10-CM

## 2023-02-04 LAB — HM HIV SCREENING LAB: HM HIV Screening: NEGATIVE

## 2023-02-04 NOTE — Progress Notes (Signed)
Pt is here for STD screening, condoms declined.  

## 2023-02-04 NOTE — Progress Notes (Signed)
[facility] Department STI clinic/screening visit  Subjective:  Dennis Burgess is a 42 y.o. male being seen today for an STI screening visit. The patient reports they do not have symptoms.    Patient has the following medical conditions:  There are no problems to display for this patient.    Chief Complaint  Patient presents with   SEXUALLY TRANSMITTED DISEASE    No symptoms, multiple partners per pt     HPI  Patient reports to clinic for STI testing. Asymptomatic  Last HIV test per patient/review of record was No results found for: "HMHIVSCREEN" No results found for: "HIV"  Does the patient or their partner desires a pregnancy in the next year? No  Screening for MPX risk: Does the patient have an unexplained rash? No Is the patient MSM? No Does the patient endorse multiple sex partners or anonymous sex partners? Yes Did the patient have close or sexual contact with a person diagnosed with MPX? No Has the patient traveled outside the [place] where MPX is endemic? No Is there a high clinical suspicion for MPX-- evidenced by one of the following No  -Unlikely to be chickenpox  -Lymphadenopathy  -Rash that present in same phase of evolution on any given body part   See flowsheet for further details and programmatic requirements.    There is no immunization history on file for this patient.   The following portions of the patient's history were reviewed and updated as appropriate: allergies, current medications, past medical history, past social history, past surgical history and problem list.  Objective:  There were no vitals filed for this visit.  Physical Exam Vitals and nursing note reviewed.  Constitutional:      Appearance: Normal appearance.  HENT:     Head: Normocephalic and atraumatic.     Mouth/Throat:     Mouth: Mucous membranes are moist.     Pharynx: No oropharyngeal exudate or posterior oropharyngeal erythema.  Eyes:     General:         Right eye: No discharge.        Left eye: No discharge.     Conjunctiva/sclera:     Right eye: Right conjunctiva is not injected. No exudate.    Left eye: Left conjunctiva is not injected. No exudate. Pulmonary:     Effort: Pulmonary effort is normal.  Abdominal:     General: Abdomen is flat.     Palpations: Abdomen is soft. There is no hepatomegaly or mass.     Tenderness: There is no abdominal tenderness. There is no rebound.  Genitourinary:    Comments: Declined genital exam- asymptomatic Lymphadenopathy:     Cervical: No cervical adenopathy.     Upper Body:     Right upper body: No supraclavicular or axillary adenopathy.     Left upper body: No supraclavicular or axillary adenopathy.  Skin:    General: Skin is warm and dry.  Neurological:     Mental Status: He is alert and oriented to person, place, and time.       Assessment and Plan:  Dennis Burgess is a 42 y.o. male presenting to the [facility] Department for STI screening  1. Screening for venereal disease  - HIV [place] [facility] - Syphilis Serology, [place] [facility] - Chlamydia/GC NAA, Confirmation   Patient does not have STI symptoms Patient accepted all screenings including  urine GC/Chlamydia, and blood work for HIV/Syphilis. Patient meets criteria for HepB screening? No. Ordered?  not applicable Patient meets criteria for HepC screening? No. Ordered? not applicable Recommended condom use with all sex Discussed importance of condom use for STI prevent  Treat positive test results per standing order. Discussed time line for State [facility] results and that patient will be called with positive results and encouraged patient to call if he had not heard in 2 weeks Recommended repeat testing in 3 months with positive results. Recommended returning for continued or worsening symptoms.   Return if symptoms worsen or fail to improve, for STI screening.  No future appointments. Total time spent 20  minutes Lenice Llamas, [place]

## 2023-02-09 LAB — CHLAMYDIA/GC NAA, CONFIRMATION
Chlamydia trachomatis, NAA: NEGATIVE
Neisseria gonorrhoeae, NAA: NEGATIVE

## 2023-11-22 ENCOUNTER — Other Ambulatory Visit: Payer: Self-pay

## 2023-11-22 ENCOUNTER — Emergency Department: Payer: Self-pay

## 2023-11-22 DIAGNOSIS — J101 Influenza due to other identified influenza virus with other respiratory manifestations: Secondary | ICD-10-CM | POA: Insufficient documentation

## 2023-11-22 LAB — RESP PANEL BY RT-PCR (RSV, FLU A&B, COVID)  RVPGX2
Influenza A by PCR: POSITIVE — AB
Influenza B by PCR: NEGATIVE
Resp Syncytial Virus by PCR: NEGATIVE
SARS Coronavirus 2 by RT PCR: NEGATIVE

## 2023-11-22 NOTE — ED Triage Notes (Signed)
 Pt to ED via POV c/o cough, congestion, body aches, and headache x3days. Has worsening cough. Pt has been using mucinex with no relief. Denies CP, SHOB, dizziness, fevers

## 2023-11-23 ENCOUNTER — Emergency Department
Admission: EM | Admit: 2023-11-23 | Discharge: 2023-11-23 | Disposition: A | Discharge status: Home / Self Care | Payer: Self-pay | Attending: Emergency Medicine | Admitting: Emergency Medicine

## 2023-11-23 DIAGNOSIS — J101 Influenza due to other identified influenza virus with other respiratory manifestations: Secondary | ICD-10-CM

## 2023-11-23 MED ORDER — HYDROCOD POLI-CHLORPHE POLI ER 10-8 MG/5ML PO SUER: 5.0000 mL | Freq: Two times a day (BID) | ORAL | 0 refills | Status: AC | PRN

## 2023-11-23 MED ORDER — IBUPROFEN 800 MG PO TABS
800.0000 mg | ORAL_TABLET | Freq: Once | ORAL | Status: AC
  Administered 2023-11-23: 800 mg via ORAL
  Filled 2023-11-23: qty 1

## 2023-11-23 MED ORDER — MAGIC MOUTHWASH
10.0000 mL | Freq: Once | ORAL | Status: AC
  Administered 2023-11-23: 10 mL via ORAL
  Filled 2023-11-23: qty 10

## 2023-11-23 NOTE — ED Provider Notes (Signed)
 [facility] Provider Note    Event Date/Time   First MD Initiated Contact with Patient 11/23/23 0231     (approximate)   History   Cough and Nasal Congestion   HPI  Dennis Burgess is a 43 y.o. male who presents to the ED from home with a 3 to 4-day history of dry cough, congestion, body aches and headache.  Taking Mucinex over-the-counter without relief of symptoms.  Denies associated fever/chills, chest pain, shortness of breath, abdominal pain, nausea, vomiting or dizziness.     Past Medical History  History reviewed. No pertinent past medical history.   Active Problem List  There are no active problems to display for this patient.    Past Surgical History  History reviewed. No pertinent surgical history.   Home Medications   Prior to Admission medications   Medication Sig Start Date End Date Taking? Authorizing Provider  chlorpheniramine-HYDROcodone (TUSSIONEX) 10-8 MG/5ML Take 5 mLs by mouth every 12 (twelve) hours as needed for cough. 11/23/23  Yes Irean Hong, MD  clotrimazole-betamethasone (LOTRISONE) cream Apply 1 application topically 2 (two) times daily. Patient not taking: Reported on 02/04/2023 06/20/18   Felecia Shelling, DPM  ibuprofen (ADVIL,MOTRIN) 800 MG tablet Take 1 tablet (800 mg total) by mouth every 8 (eight) hours as needed for moderate pain. Patient not taking: Reported on 02/04/2023 08/30/17   Joni Reining, PA-C     Allergies  Patient has no known allergies.   Family History  History reviewed. No pertinent family history.   Physical Exam  Triage Vital Signs: ED Triage Vitals  Encounter Vitals Group     BP 11/22/23 2146 (!) 136/103     Systolic BP Percentile --      Diastolic BP Percentile --      Pulse Rate 11/22/23 2146 94     Resp 11/22/23 2146 18     Temp 11/22/23 2146 98.7 F (37.1 C)     Temp Source 11/22/23 2146 Oral     SpO2 11/22/23 2146 96 %     Weight 11/22/23 2145 180 lb (81.6 kg)      Height 11/22/23 2145 5\' 6"  (1.676 m)     Head Circumference --      Peak Flow --      Pain Score 11/22/23 2144 7     Pain Loc --      Pain Education --      Exclude from Growth Chart --     Updated Vital Signs: BP (!) 128/90 (BP Location: Left Arm)   Pulse 78   Temp 98.5 F (36.9 C) (Oral)   Resp 17   Ht 5\' 6"  (1.676 m)   Wt 81.6 kg   SpO2 96%   BMI 29.05 kg/m    General: Awake, no distress.  CV:  RRR.  Good peripheral perfusion.  Resp:  Normal effort.  CTAB. Abd:  No distention.  Other:  Nasal congestion noted.  Mildly erythematous posterior oropharynx without tonsillar swelling, exudates or peritonsillar abscess.  There is no hoarse or muffled voice.  There is no drooling.  No cervical LAD.   ED Results / Procedures / Treatments  Labs (all labs ordered are listed, but only abnormal results are displayed) Labs Reviewed  RESP PANEL BY RT-PCR (RSV, FLU A&B, COVID)  RVPGX2 - Abnormal; Notable for the following components:      Result Value   Influenza A by PCR POSITIVE (*)    All other components  within normal limits     EKG  None   RADIOLOGY I have independently visualized and interpreted patient's imaging study as well as noted the radiology interpretation:  Chest x-ray: Mild right basilar atelectasis  Official radiology report(s): DG Chest 2 View Result Date: 11/22/2023 CLINICAL DATA:  None EXAM: CHEST - 2 VIEW COMPARISON:  None Available. FINDINGS: Mild right basilar atelectasis or scarring. The lungs are otherwise clear. No pneumothorax or pleural effusion. Cardiac size within limits. Pulmonary vascularity is normal. No acute bone abnormality . IMPRESSION: 1. Mild right basilar atelectasis or scarring. Electronically Signed   By: Helyn Numbers M.D.   On: 11/22/2023 23:32     PROCEDURES:  Critical Care performed: No  Procedures   MEDICATIONS ORDERED IN ED: Medications  magic mouthwash (10 mLs Oral Given 11/23/23 0246)  ibuprofen (ADVIL) tablet 800 mg  (800 mg Oral Given 11/23/23 0245)     IMPRESSION / MDM / ASSESSMENT AND PLAN / ED COURSE  I reviewed the triage vital signs and the nursing notes.                             43 year old male presenting with cold-like symptoms.  He is positive for influenza A.  Out of the window for Tamiflu.  Will treat supportively with ibuprofen and Magic mouthwash in the emergency department; prescription for Tussionex to use as needed at home.  Strict return precautions given.  Patient verbalizes understanding and agrees with plan of care.  Patient's presentation is most consistent with acute, uncomplicated illness.    FINAL CLINICAL IMPRESSION(S) / ED DIAGNOSES   Final diagnoses:  Influenza A     Rx / DC Orders   ED Discharge Orders          Ordered    chlorpheniramine-HYDROcodone (TUSSIONEX) 10-8 MG/5ML  Every 12 hours PRN        11/23/23 0236             Note:  This document was prepared using Dragon voice recognition software and may include unintentional dictation errors.   Irean Hong, MD 11/23/23 [phone]

## 2023-11-23 NOTE — Discharge Instructions (Signed)
 You may take Tylenol and/or Ibuprofen as needed for fever.  You take Tussionex as needed for cough.  Drink plenty of fluids daily.  Return to the ER for worsening symptoms, persistent vomiting, difficulty breathing or other concerns.

## 2024-03-07 ENCOUNTER — Ambulatory Visit: Payer: Self-pay | Admitting: Physician Assistant

## 2024-03-07 ENCOUNTER — Encounter: Payer: Self-pay | Admitting: Physician Assistant

## 2024-03-07 DIAGNOSIS — Z202 Contact with and (suspected) exposure to infections with a predominantly sexual mode of transmission: Secondary | ICD-10-CM

## 2024-03-07 DIAGNOSIS — Z113 Encounter for screening for infections with a predominantly sexual mode of transmission: Secondary | ICD-10-CM

## 2024-03-07 DIAGNOSIS — N341 Nonspecific urethritis: Secondary | ICD-10-CM

## 2024-03-07 LAB — GRAM STAIN

## 2024-03-07 MED ORDER — METRONIDAZOLE 500 MG PO TABS: 2000.0000 mg | ORAL_TABLET | Freq: Once | ORAL | Status: AC

## 2024-03-07 MED ORDER — DOXYCYCLINE HYCLATE 100 MG PO TABS: 100.0000 mg | ORAL_TABLET | Freq: Two times a day (BID) | ORAL | Status: AC

## 2024-03-07 NOTE — Progress Notes (Signed)
 [facility] Department STI clinic [address] [place] [address] Main phone: [phone]  STI screening visit  Subjective:  Dennis Burgess is a 43 y.o. male being seen today for an STI screening visit. The patient reports they do have symptoms. Have sexual contact with person diagnosed with trichomonas.   Patient has the following medical conditions:  There are no active problems to display for this patient.  Chief Complaint  Patient presents with   SEXUALLY TRANSMITTED DISEASE   HPI Patient reports one of his sexual partners has been diagnosed with trichomonas. He was notified last night. Does have some penile discharge intermittently. No other symptoms. Has had four sex partners in last 2 months, uses condoms sometimes. Declines condoms today.  See flowsheet for further details and programmatic requirements  Hyperlink available at the top of the signed note in blue.  Flow sheet content below:  Pregnancy Intention Screening Does the patient want to become pregnant in the next year?: No Does the patient's partner want to become pregnant in the next year?: No Would the patient like to discuss contraceptive options today?: No Reason For STD Screen STD Screening: Has symptoms, Is a contact Have you ever had an STD?: Yes History of Antibiotic use in the past 2 weeks?: No STD Symptoms Denies all: No Genital Itching: No Lower abdominal pain: No Discharge: Yes Dysuria: No Genital ulcer / lesion: No Rash: No Vaginal irritation: No Oral / Other skin ulcer: No Pain with sex: No Sore Throat: No Visual Changes: No Vaginal Bleeding: No Risk Factors for Hep B Household, sexual, or needle sharing contact of a person infected with Hep B: No Sexual contact with a person who uses drugs not as prescribed?: No Currently or Ever used drugs not as prescribed: Yes HIV Positive: No PRep Patient: No Men who have sex with men: No Have Hepatitis  C: No History of Incarceration: No History of Homeslessness?: No Anal sex following anal drug use?: No Risk Factors for Hep C Currently using drugs not as prescribed: Yes Sexual partner(s) currently using drugs as not prescribed: No History of drug use: Yes HIV Positive: No People with a history of incarceration: No People born between the years of 65 and 44: No Hepatitis Counseling Hep B Counseling: Counseled patient about increased risk of Hep B and recommendation for testing, Patient accepts testing for Hep B today Hep C Counseling: Counseled patient about increased risk of Hep C and recommendation for testing, Patient accepts testing for Hep C today Advise Advised client to quit or stay quit. : Yes Assess Is patient ready to quit in next 30 days?: No Abuse History Has patient ever been abused physically?: No Has patient ever been abused sexually?: No Does patient feel they have a problem with Anxiety?: No Does patient feel they have a problem with Depression?: No Counseling Medication side effects discussed with patient?: Yes Patient counseled to abstain from sex for: 14 days Patient counseled to abstain from sex for?: 7 days post partner's treatment Patient counseled to avoid alcohol for?: 14 days Patient counseled to use condoms with all sex: Condoms declined RTC in 2-3 weeks for test results: Yes Clinic will call if test results abnormal before test result appt.: Yes Patient should return to the clinic for re-treatment if vomits within 2 hours after taking meds   :  (Call clinic if has problems tolerating medicine.) BCM given today through [facility] clinic:  (Condoms declined.) Immunizations: Referred Test results given to  patient Patient counseled to use condoms with all sex: Condoms declined STD Treatment Patient counseled to abstain from sex for: 14 days Patient counseled to abstain from sex for?: 7 days post partner's treatment Patient counseled to avoid alcohol for?:  14 days  Screening for MPX risk: Does the patient have an unexplained rash? No Is the patient MSM? No Does the patient endorse multiple sex partners or anonymous sex partners? No Did the patient have close or sexual contact with a person diagnosed with MPX? No Has the patient traveled outside the [place]  where MPX is endemic? No Is there a high clinical suspicion for MPX-- evidenced by one of the following No  -Unlikely to be chickenpox  -Lymphadenopathy  -Rash that present in same phase of evolution on any given body part  STI screening history: Last HIV test per patient/review of record was  Lab Results  Component Value Date   HMHIVSCREEN Negative - Validated 02/04/2023   No results found for: HIV  Last HEPC test per patient/review of record was No results found for: HMHEPCSCREEN No components found for: HEPC   Last HEPB test per patient/review of record was No components found for: HMHEPBSCREEN   Fertility: Does the patient or their partner desires a pregnancy in the next year? No   There is no immunization history on file for this patient.  The following portions of the patient's history were reviewed and updated as appropriate: allergies, current medications, past medical history, past social history, past surgical history and problem list.  Objective:  There were no vitals filed for this visit.  Physical Exam Exam conducted with a chaperone present Susette Satchel [facility] with Dr. Macario).  Constitutional:      Appearance: Normal appearance.  HENT:     Head: Normocephalic and atraumatic.     Mouth/Throat:     Mouth: Mucous membranes are moist. No oral lesions.     Pharynx: Oropharynx is clear. No oropharyngeal exudate or posterior oropharyngeal erythema.  Eyes:     General:        Right eye: No discharge.        Left eye: No discharge.     Conjunctiva/sclera:     Right eye: Right conjunctiva is not injected. No exudate.    Left eye: Left conjunctiva is not injected.  No exudate. Pulmonary:     Effort: Pulmonary effort is normal.  Genitourinary:    Pubic Area: No rash or pubic lice (no nits).      Penis: Normal. No tenderness, discharge, swelling or lesions.      Comments: Penile Discharge not observed on exam but reported by patient Lymphadenopathy:     Head:     Right side of head: No preauricular or posterior auricular adenopathy.     Left side of head: No preauricular or posterior auricular adenopathy.     Cervical: No cervical adenopathy.     Upper Body:     Right upper body: No supraclavicular adenopathy.     Left upper body: No supraclavicular adenopathy.  Skin:    General: Skin is warm and dry.     Findings: No lesion or rash.  Neurological:     Mental Status: He is alert and oriented to person, place, and time.    Assessment and Plan:  Blanton Kardell Pallett is a 43 y.o. male presenting to the [facility] Department for STI screening  1. Screening for venereal disease (Primary)  - Gonococcus culture - Chlamydia/GC NAA, Confirmation - Gram  stain - HIV/HCV [place] [facility] - HBV Antigen/Antibody [facility] - Syphilis Serology, [place] [facility]  2. Trichomonas contact  - metroNIDAZOLE  (FLAGYL ) 500 MG tablet; Take 4 tablets (2,000 mg total) by mouth once for 1 dose.  3. NGU (nongonococcal urethritis)  - doxycycline  (VIBRA -TABS) 100 MG tablet; Take 1 tablet (100 mg total) by mouth 2 (two) times daily for 7 days.   Patient does have STI symptoms Patient accepted the following screenings: oral GC culture, penile gram stain for GC, urine CT/GC, HIV, RPR, Hep B, and Hep C Patient meets criteria for HepB screening? Yes. Ordered? yes Patient meets criteria for HepC screening? Yes. Ordered? yes Recommended condom use with all sex Discussed importance of condom use for STI prevention  Treat positive test results per standing order. Discussed time line for State Lab results and that patient will be called with positive results and  encouraged patient to call if he had not heard in 2 weeks Recommended repeat testing in 3 months with positive results. Recommended returning for continued or worsening symptoms.   Return if symptoms worsen or fail to improve.  No future appointments.  Damien Satchel [facility]  Attestation of Attending Supervision of Advanced Practice Provider (CNM/NP/PA):  Evaluation, management, and procedures were performed by the Advanced Practice Provider under my supervision and collaboration.  I have reviewed the Advanced Practice Provider's note and chart, and I agree with the management and plan.  Dorothyann Helling, MD Clinical Services Medical Director [facility] Department 03/07/24  4:52 PM

## 2024-03-07 NOTE — Progress Notes (Addendum)
 Reports is a contact to trich and treated per provider order. Wet prep reviewed - WBC are greater than / equal to 2/hpf. Treated for NGU per provider order. Intracellular gram negative diplococci are absent. Burnadette Lowers, RN Per secure chat from Etta Munroe RN-Nursing Supervisor 04/19/24, client did not have bloodwork labs completed at appt. Ronnald Orange, Community Health Technician in [facility] talked with Luke, Medical Technologist in [facility] lab regarding labs. Per Luke, client became scared when in lab and declined lab testing. Ordered bloodwork cancelled 04/19/24. ASABRA Irvine PA-C notified of above. Burnadette Lowers, RN

## 2024-03-08 ENCOUNTER — Ambulatory Visit: Payer: Self-pay

## 2024-03-10 LAB — CHLAMYDIA/GC NAA, CONFIRMATION
Chlamydia trachomatis, NAA: NEGATIVE
Neisseria gonorrhoeae, NAA: NEGATIVE

## 2024-03-12 LAB — GONOCOCCUS CULTURE

## 2024-04-19 NOTE — Addendum Note (Signed)
 Addended by: BUTLER BURNADETTE POUR on: 04/19/2024 03:56 PM   Modules accepted: Orders
